# Patient Record
Sex: Male | Born: 1990
Health system: Southern US, Community
[De-identification: ages and names within clinical notes are randomized; demographics above are authoritative.]

## PROBLEM LIST (undated history)

## (undated) HISTORY — PX: CIRCUMCISION: SUR203

---

## 2017-12-15 DIAGNOSIS — Z3141 Encounter for fertility testing: Secondary | ICD-10-CM | POA: Diagnosis not present

## 2018-01-05 ENCOUNTER — Encounter (HOSPITAL_COMMUNITY): Payer: Self-pay | Admitting: Emergency Medicine

## 2018-01-05 ENCOUNTER — Ambulatory Visit (HOSPITAL_COMMUNITY)
Admission: EM | Admit: 2018-01-05 | Discharge: 2018-01-05 | Disposition: A | Discharge status: Home / Self Care | Payer: 59 | Attending: Family Medicine | Admitting: Family Medicine

## 2018-01-05 DIAGNOSIS — R51 Headache: Secondary | ICD-10-CM | POA: Diagnosis not present

## 2018-01-05 DIAGNOSIS — R519 Headache, unspecified: Secondary | ICD-10-CM

## 2018-01-05 DIAGNOSIS — D170 Benign lipomatous neoplasm of skin and subcutaneous tissue of head, face and neck: Secondary | ICD-10-CM

## 2018-01-05 NOTE — Discharge Instructions (Signed)
Drink plenty of water. I recommend recheck of vision as this can contribute to headaches.  Tylenol and/or ibuprofen as needed for pain. May follow up with general surgery for lipoma removal if wanted.

## 2018-01-05 NOTE — ED Provider Notes (Signed)
[place]    CSN: [id number] Arrival date & time: 01/05/18  1247     History   Chief Complaint Chief Complaint  Patient presents with  . Headache  . Mass    HPI Robert Downs is a 27 y.o. male.   Robert Downs presents with complaints of headache to right side of head which stated today, mild, rates it 4/10. States feels similar to other headaches he has had, gets them occasional. Light does worsen the pain. No noise or movement sensitivity. No nausea or vomiting. No dizziness or vision changes. States he feels his right eye vision over time has worsened, has not had it checked in over two years, wears glasses. No fatigue or weight loss. Has not taken any medications for symptoms.  Also complaints of lump to scalp at hairline. Has been present for 4-5 years. Non tender. States occasionally it stings. He denies any growth to it. No redness. No drainage.  Does not have a PCP.   ROS per HPI.      History reviewed. No pertinent past medical history.  There are no active problems to display for this patient.   Past Surgical History:  Procedure Laterality Date  . CIRCUMCISION         Home Medications    Prior to Admission medications   Medication Sig Start Date End Date Taking? Authorizing Provider  Nutritional Supplements (CONCEPTIONXR REPRODUCTIVE PO) Take by mouth.   Yes [provider]    Family History No family history on file.  Social History Social History   Tobacco Use  . Smoking status: Never Smoker  Substance Use Topics  . Alcohol use: Yes  . Drug use: Not on file     Allergies   Patient has no known allergies.   Review of Systems Review of Systems   Physical Exam Triage Vital Signs ED Triage Vitals [01/05/18 1259]  Enc Vitals Group     BP 138/75     Pulse Rate 88     Resp 16     Temp 98.4 F (36.9 C)     Temp src      SpO2 100 %     Weight      Height      Head Circumference      Peak Flow      Pain Score       Pain Loc      Pain Edu?      Excl. in [place]?    No data found.  Updated Vital Signs BP 138/75   Pulse 88   Temp 98.4 F (36.9 C)   Resp 16   SpO2 100%   Visual Acuity Right Eye Distance:   Left Eye Distance:   Bilateral Distance:    Right Eye Near:   Left Eye Near:    Bilateral Near:     Physical Exam  Constitutional: He is oriented to person, place, and time. He appears well-developed and well-nourished.  Non-toxic appearance. He does not appear ill. No distress.  HENT:  Head:    Approximately 1 cm palpable rubbery mobile mass to scalp at right hairline; without redness or tenderness   Eyes: Pupils are equal, round, and reactive to light. EOM are normal.  Neck: Normal range of motion.  Cardiovascular: Normal rate and regular rhythm.  Pulmonary/Chest: Effort normal and breath sounds normal.  Neurological: He is alert and oriented to person, place, and time. He has normal strength. He is not disoriented. No  cranial nerve deficit or sensory deficit. GCS eye subscore is 4. GCS verbal subscore is 5. GCS motor subscore is 6.  Skin: Skin is warm and dry.  Psychiatric: He has a normal mood and affect.     UC Treatments / Results  Labs (all labs ordered are listed, but only abnormal results are displayed) Labs Reviewed - No data to display  EKG None  Radiology No results found.  Procedures Procedures (including critical care time)  Medications Ordered in UC Medications - No data to display  Initial Impression / Assessment and Plan / UC Course  I have reviewed the triage vital signs and the nursing notes.  Pertinent labs & imaging results that were available during my care of the patient were reviewed by me and considered in my medical decision making (see chart for details).     Non toxic in appearance. Without distress. Declines any medication for headache at this time. Going to make an appointment for vision recheck, discussed that this can contribute to  headache. What appears to be lipoma to right scalp, unchanging, not painful, no infection, no drainage, not firm. Plastic surgeon information provided per patient request. Return precautions provided. Patient verbalized understanding and agreeable to plan.  Ambulatory out of clinic without difficulty.    Final Clinical Impressions(s) / UC Diagnoses   Final diagnoses:  Acute nonintractable headache, unspecified headache type  Lipoma of head     Discharge Instructions     Drink plenty of water. I recommend recheck of vision as this can contribute to headaches.  Tylenol and/or ibuprofen as needed for pain. May follow up with general surgery for lipoma removal if wanted.    ED Prescriptions    None     Controlled Substance Prescriptions [place] Controlled Substance Registry consulted? Not Applicable   Zigmund Gottron, NP 01/05/18 1353

## 2018-01-05 NOTE — ED Triage Notes (Signed)
Pt c/o bump on the side of his head x4-5 years. Pt c/o headaches this past week as well.

## 2018-03-23 DIAGNOSIS — Z0000 Encounter for general adult medical examination without abnormal findings: Secondary | ICD-10-CM | POA: Diagnosis not present

## 2018-07-13 DIAGNOSIS — E781 Pure hyperglyceridemia: Secondary | ICD-10-CM | POA: Diagnosis not present

## 2019-08-07 ENCOUNTER — Other Ambulatory Visit: Payer: 59

## 2019-08-09 ENCOUNTER — Ambulatory Visit: Payer: 59 | Attending: Internal Medicine

## 2019-08-09 DIAGNOSIS — Z20822 Contact with and (suspected) exposure to covid-19: Secondary | ICD-10-CM

## 2019-08-11 LAB — NOVEL CORONAVIRUS, NAA: SARS-CoV-2, NAA: NOT DETECTED

## 2020-02-14 ENCOUNTER — Emergency Department (HOSPITAL_COMMUNITY)
Admission: EM | Admit: 2020-02-14 | Discharge: 2020-02-14 | Disposition: A | Discharge status: Home / Self Care | Payer: 59 | Attending: Emergency Medicine | Admitting: Emergency Medicine

## 2020-02-14 ENCOUNTER — Encounter (HOSPITAL_COMMUNITY): Payer: Self-pay

## 2020-02-14 DIAGNOSIS — M549 Dorsalgia, unspecified: Secondary | ICD-10-CM | POA: Insufficient documentation

## 2020-02-14 DIAGNOSIS — S060X0A Concussion without loss of consciousness, initial encounter: Secondary | ICD-10-CM

## 2020-02-14 DIAGNOSIS — R519 Headache, unspecified: Secondary | ICD-10-CM | POA: Insufficient documentation

## 2020-02-14 DIAGNOSIS — H539 Unspecified visual disturbance: Secondary | ICD-10-CM | POA: Insufficient documentation

## 2020-02-14 DIAGNOSIS — M542 Cervicalgia: Secondary | ICD-10-CM | POA: Diagnosis not present

## 2020-02-14 DIAGNOSIS — R42 Dizziness and giddiness: Secondary | ICD-10-CM | POA: Insufficient documentation

## 2020-02-14 DIAGNOSIS — R110 Nausea: Secondary | ICD-10-CM | POA: Insufficient documentation

## 2020-02-14 DIAGNOSIS — Y999 Unspecified external cause status: Secondary | ICD-10-CM | POA: Diagnosis not present

## 2020-02-14 DIAGNOSIS — Y929 Unspecified place or not applicable: Secondary | ICD-10-CM | POA: Diagnosis not present

## 2020-02-14 DIAGNOSIS — Y939 Activity, unspecified: Secondary | ICD-10-CM | POA: Diagnosis not present

## 2020-02-14 MED ORDER — PROCHLORPERAZINE MALEATE 10 MG PO TABS: 10.0000 mg | ORAL_TABLET | Freq: Two times a day (BID) | ORAL | 0 refills | Status: AC | PRN

## 2020-02-14 NOTE — ED Notes (Signed)
Pt d/c home per MD order. Discharge summary reviewed with pt, pt verbalizes understanding. Ambulatory off unit. No s/s of acute distress noted.
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
## 2020-02-18 ENCOUNTER — Other Ambulatory Visit: Payer: Self-pay

## 2020-02-18 ENCOUNTER — Encounter: Payer: Self-pay | Admitting: Emergency Medicine

## 2020-02-18 ENCOUNTER — Emergency Department: Payer: 59

## 2020-02-18 ENCOUNTER — Emergency Department
Admission: EM | Admit: 2020-02-18 | Discharge: 2020-02-18 | Disposition: A | Discharge status: Home / Self Care | Payer: 59 | Attending: Student in an Organized Health Care Education/Training Program | Admitting: Student in an Organized Health Care Education/Training Program

## 2020-02-18 DIAGNOSIS — R202 Paresthesia of skin: Secondary | ICD-10-CM | POA: Insufficient documentation

## 2020-02-18 DIAGNOSIS — F0781 Postconcussional syndrome: Secondary | ICD-10-CM | POA: Diagnosis not present

## 2020-02-18 DIAGNOSIS — R519 Headache, unspecified: Secondary | ICD-10-CM | POA: Diagnosis not present

## 2020-02-18 NOTE — ED Triage Notes (Addendum)
Patient states he was in [facility] last week and seen at [facility] with negative CT, then seen again at [facility] for persistent symptoms. States he is having continued headaches and numbness to head and neck.
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
## 2020-02-18 NOTE — Discharge Instructions (Signed)
Your exam is consistent with a minor head injury and post concussive symptoms. You do not have any signs on exam or CT scan of a brain injury, swelling, or bleeding. Your symptoms are what one gets after a minor head injury. Follow-up with Neurology for continued symptoms.

## 2020-04-21 ENCOUNTER — Ambulatory Visit: Payer: 59 | Admitting: Diagnostic Neuroimaging

## 2020-04-21 ENCOUNTER — Encounter: Payer: Self-pay | Admitting: Diagnostic Neuroimaging

## 2020-04-21 ENCOUNTER — Telehealth: Payer: Self-pay | Admitting: *Deleted

## 2020-04-21 NOTE — Telephone Encounter (Signed)
Patient was no show for new patient appointment today. Of note he was seen in [facility] neurology clinic on 04/02/20 for post concussive syndrome.

## 2020-10-26 ENCOUNTER — Other Ambulatory Visit: Payer: Self-pay | Admitting: Physician Assistant

## 2020-10-26 DIAGNOSIS — Z87820 Personal history of traumatic brain injury: Secondary | ICD-10-CM

## 2020-11-03 ENCOUNTER — Ambulatory Visit (HOSPITAL_COMMUNITY)
Admission: RE | Admit: 2020-11-03 | Discharge: 2020-11-03 | Disposition: A | Discharge status: Home / Self Care | Payer: 59 | Source: Ambulatory Visit | Attending: Physician Assistant | Admitting: Physician Assistant

## 2020-11-03 DIAGNOSIS — Z87820 Personal history of traumatic brain injury: Secondary | ICD-10-CM | POA: Insufficient documentation

## 2022-11-09 IMAGING — MR MR HEAD W/O CM
13 of 14 series · 37 of 48 positions shown · non-contrast
Comparison: Head CT 02/18/2020

CLINICAL DATA: History of concussion. Headaches and weakness for 1
year.

EXAM:
MRI HEAD WITHOUT CONTRAST
TECHNIQUE: Multiplanar, multiecho pulse sequences of the brain and surrounding
structures were obtained without intravenous contrast.

[Series 5: DWI · axial · 3.0mm · 0.77mm/px · z∈[-94,+46]mm · 4 of 48 slices shown (1 of 6)]
[im 1/48]
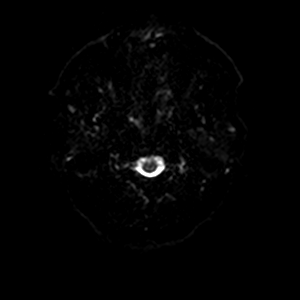
[im 16/48]
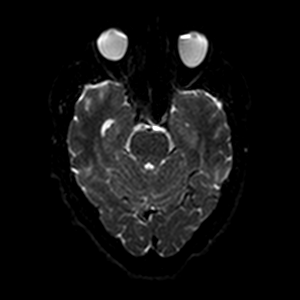
[im 32/48]
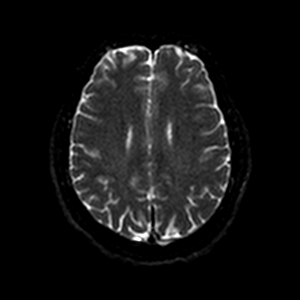
[im 48/48]
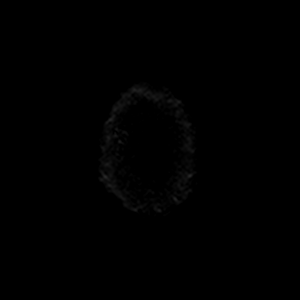

[Series 5: DWI · axial · 3.0mm · 0.77mm/px · z∈[-94,+46]mm · 4 of 48 slices shown (2 of 6)]
[im 1/48]
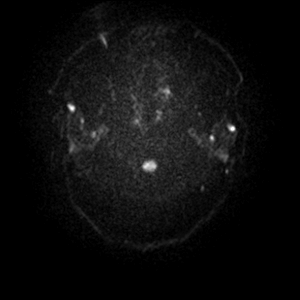
[im 16/48]
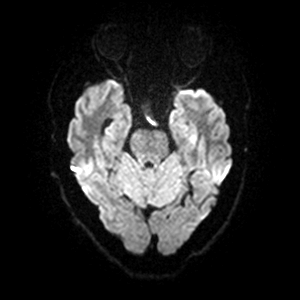
[im 32/48]
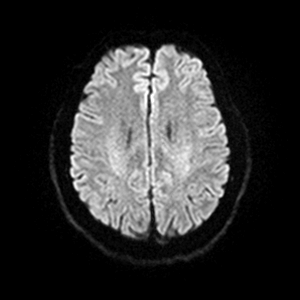
[im 48/48]
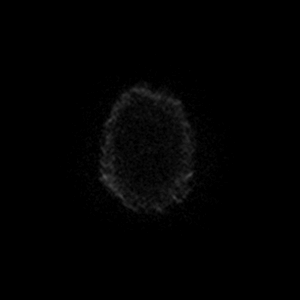

[Series 6: DWI · axial · 3.0mm · 0.77mm/px · z∈[-94,+46]mm · 4 of 48 slices shown (3 of 6)]
[im 1/48]
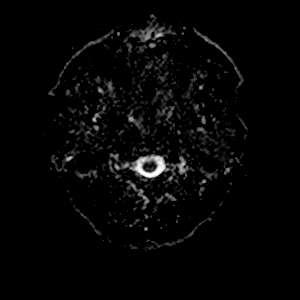
[im 16/48]
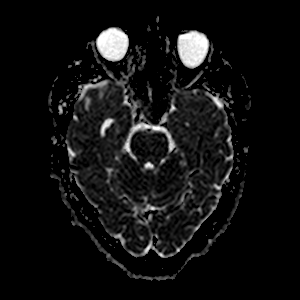
[im 32/48]
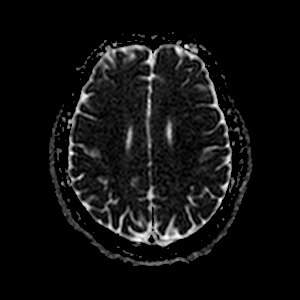
[im 48/48]
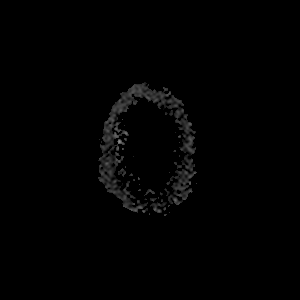

[Series 7: DWI · coronal · 5.0mm · 0.88mm/px · 2 of 28 slices shown (4 of 6)]
[im 1/28]
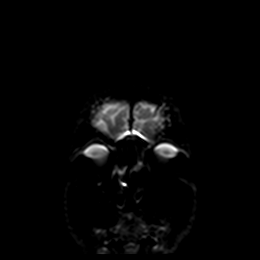
[im 28/28]
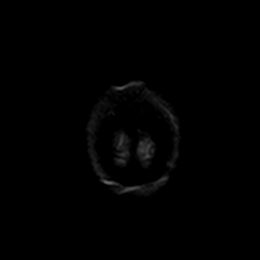

[Series 7: DWI · coronal · 5.0mm · 0.88mm/px · 2 of 28 slices shown (5 of 6)]
[im 1/28]
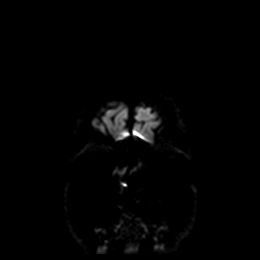
[im 28/28]
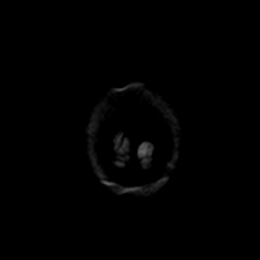

[Series 8: DWI · coronal · 5.0mm · 0.88mm/px · 2 of 28 slices shown (6 of 6)]
[im 1/28]
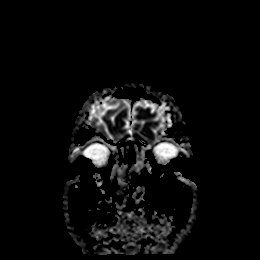
[im 28/28]
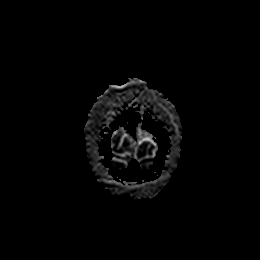

[Series 9: T1 · sagittal · 5.0mm · 0.75mm/px · 1 of 19 slices shown]
[im 1/19]
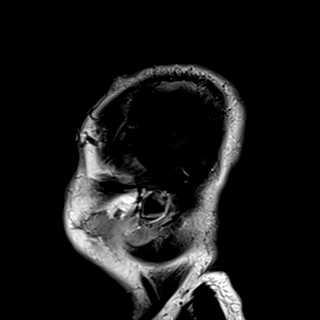

[Series 10: T2 · axial · 5.0mm · 0.72mm/px · 1 of 20 slices shown (1 of 2)]
[im 1/20]
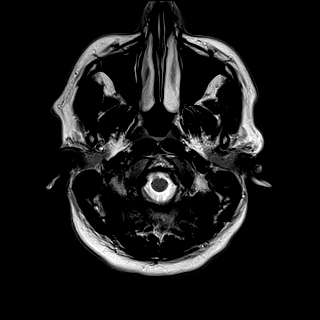

[Series 11: mag_images · axial · 3.0mm · 0.90mm/px · z∈[-99,+77]mm · 4 of 60 slices shown]
[im 1/60]
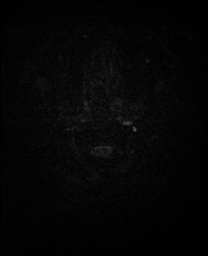
[im 20/60]
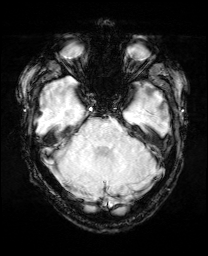
[im 40/60]
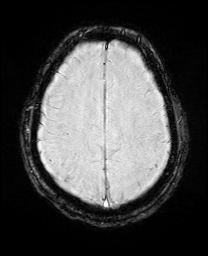
[im 60/60]
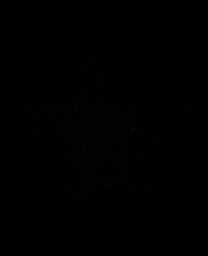

[Series 12: pha_images · axial · 3.0mm · 0.90mm/px · z∈[-96,+77]mm · 4 of 57 slices shown]
[im 1/57]
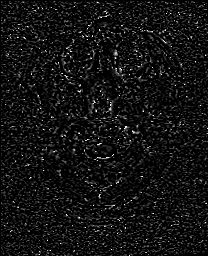
[im 19/57]
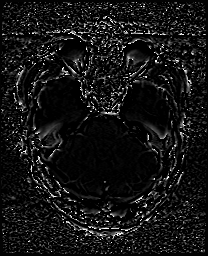
[im 38/57]
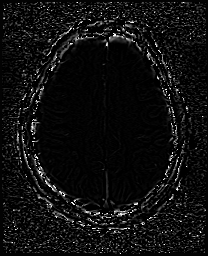
[im 57/57]
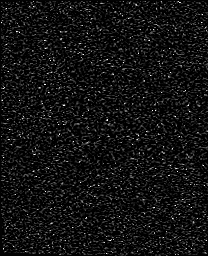

[Series 13: swi_images · axial · 3.0mm · 0.90mm/px · z∈[-99,+77]mm · 4 of 60 slices shown]
[im 1/60]
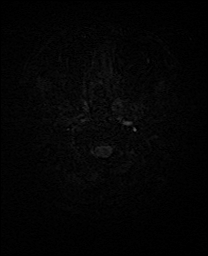
[im 20/60]
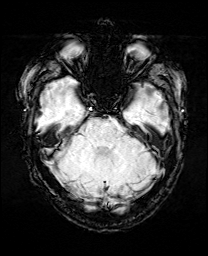
[im 40/60]
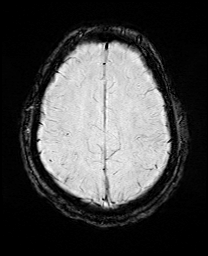
[im 60/60]
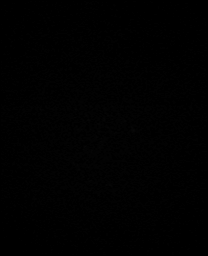

[Series 15: FLAIR · axial · 3.0mm · 0.45mm/px · z∈[-69,+47]mm · 3 of 40 slices shown]
[im 1/40]
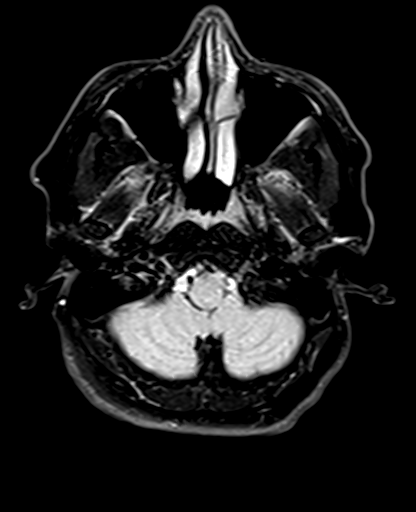
[im 20/40]
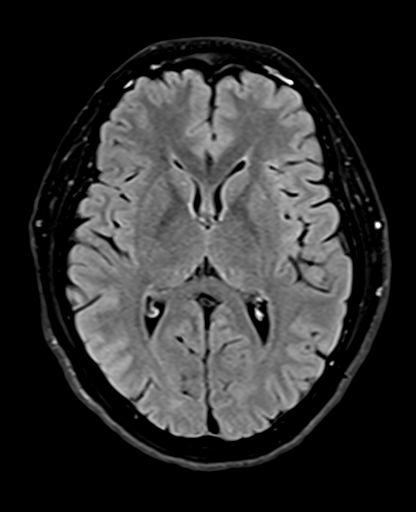
[im 40/40]
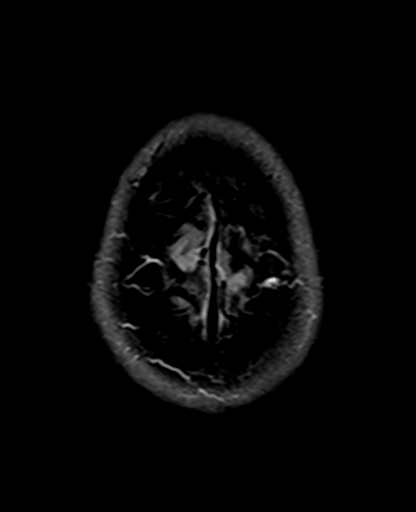

[Series 17: T2 · coronal · 5.0mm · 0.72mm/px · 2 of 28 slices shown (2 of 2)]
[im 1/28]
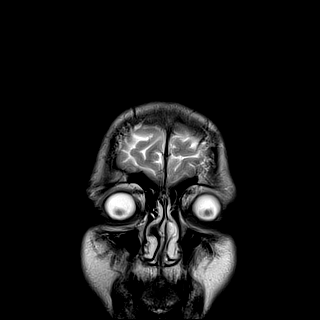
[im 28/28]
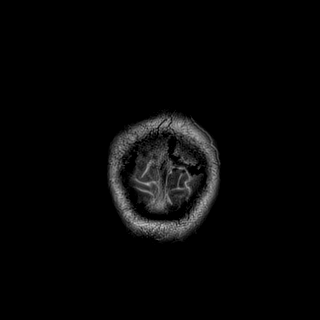

[37 of 48 positions shown; findings below may reference images not displayed]

FINDINGS: Brain: There is no evidence of an acute infarct, intracranial
hemorrhage, mass, midline shift, or extra-axial fluid collection.
The ventricles and sulci are normal. The brain is normal in signal.
The cerebellar tonsils are normally positioned. The pituitary gland
is normal in size.

Vascular: Major intracranial vascular flow voids are preserved.

Skull and upper cervical spine: No suspicious marrow lesion.

Sinuses/Orbits: Unremarkable orbits. Minimal bilateral ethmoid and
right sphenoid sinus mucosal thickening. Clear mastoid air cells.

Other: 20 x 4 mm right frontal scalp lipoma.
IMPRESSION: Unremarkable appearance of the brain.
